# Patient Record
Sex: Male | Born: 1955 | Race: White | Hispanic: No | Marital: Married
Health system: Southern US, Community
[De-identification: ages and names within clinical notes are randomized; demographics above are authoritative.]

---

## 2005-07-22 ENCOUNTER — Ambulatory Visit: Payer: Self-pay | Admitting: Gastroenterology

## 2009-10-29 ENCOUNTER — Emergency Department: Payer: Self-pay | Admitting: Internal Medicine

## 2009-12-28 DEATH — deceased

## 2011-04-04 IMAGING — US US EXTREM LOW VENOUS*L*
1 series · 17 of 24 positions shown · non-contrast
Comparison: none

REASON FOR EXAM: leg swelling
COMMENTS:

[Series 1: us extrem low venous*left* · 17 of 29 slices shown]
[im 1/29]
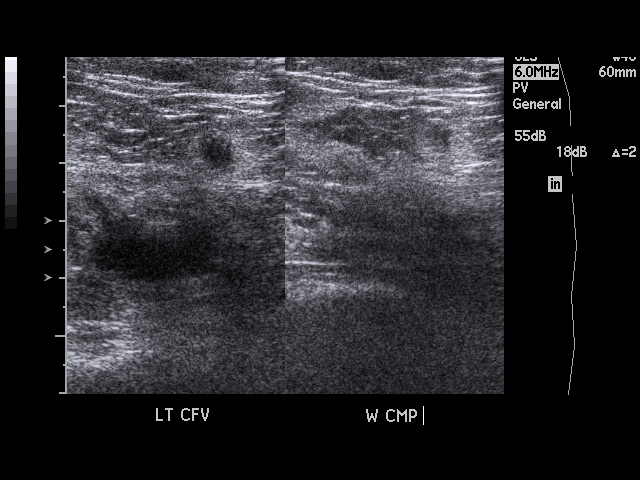
[im 3/29]
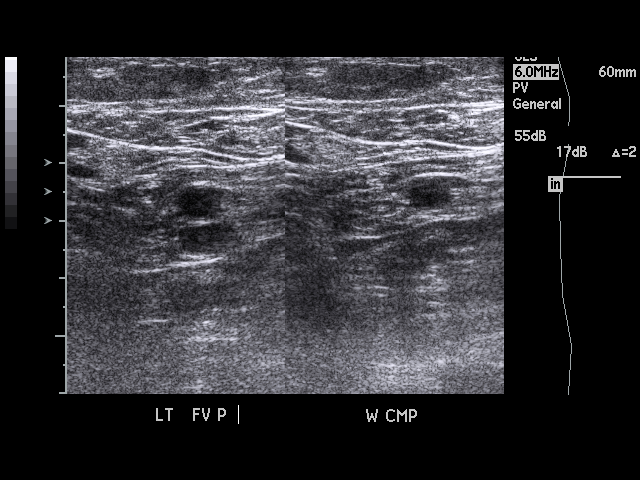
[im 4/29]
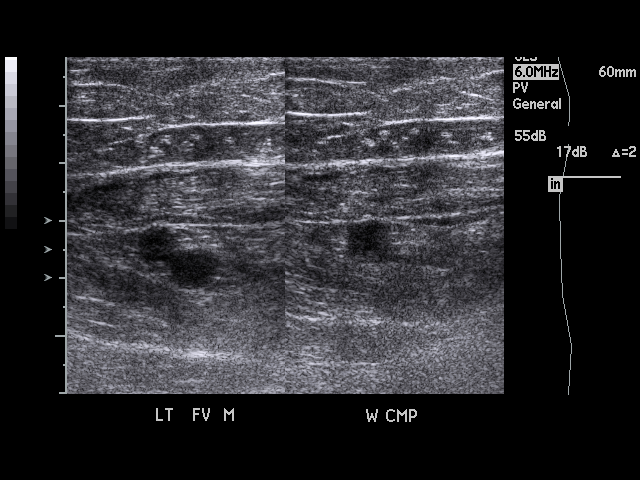
[im 5/29]
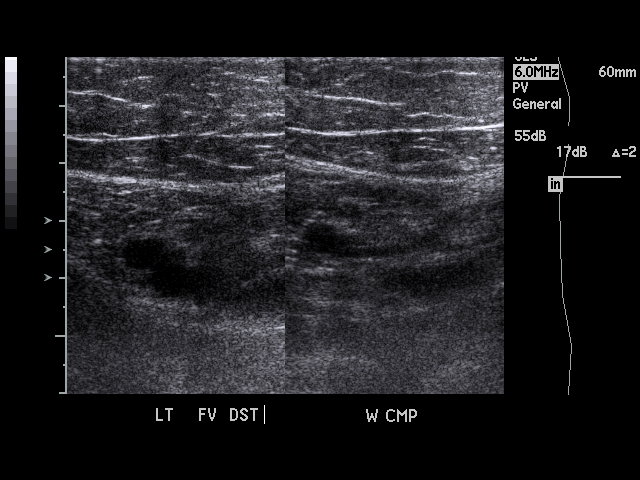
[im 8/29]
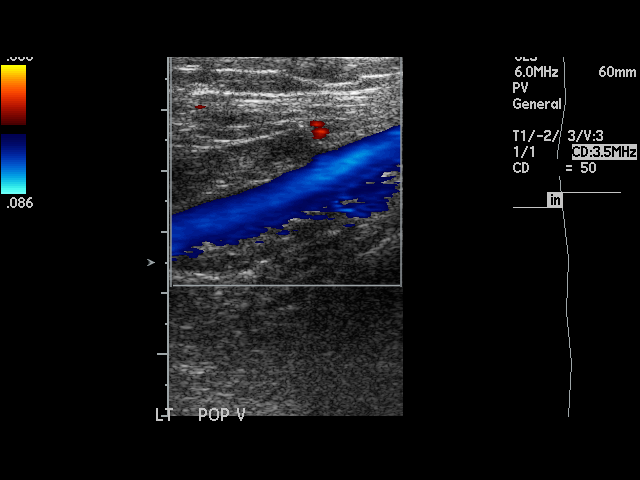
[im 9/29]
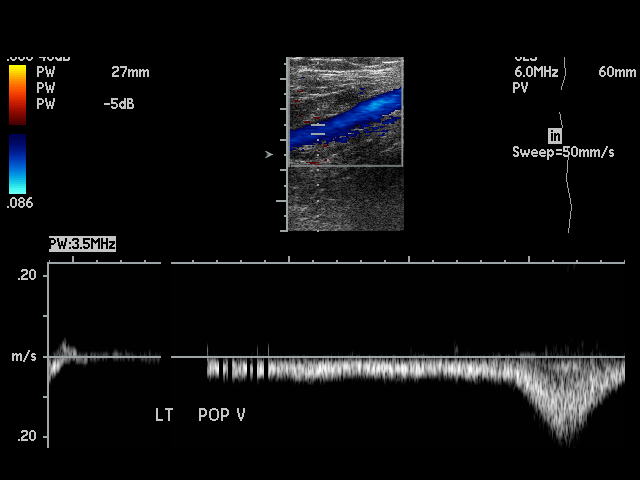
[im 11/29]
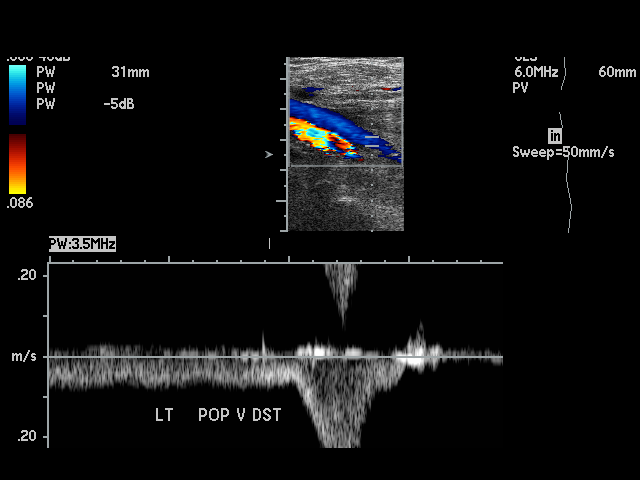
[im 13/29]
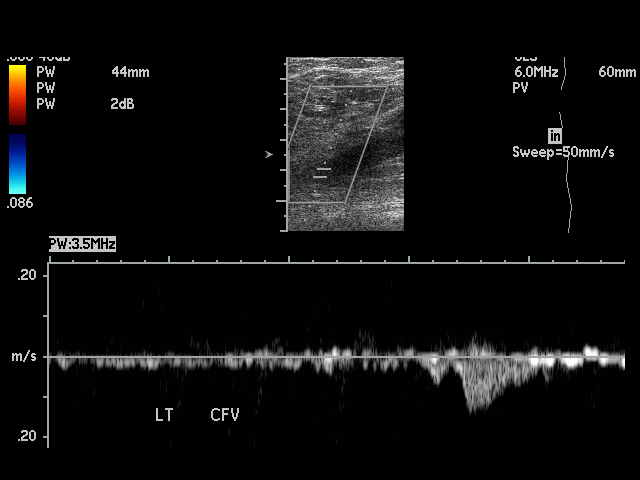
[im 15/29]
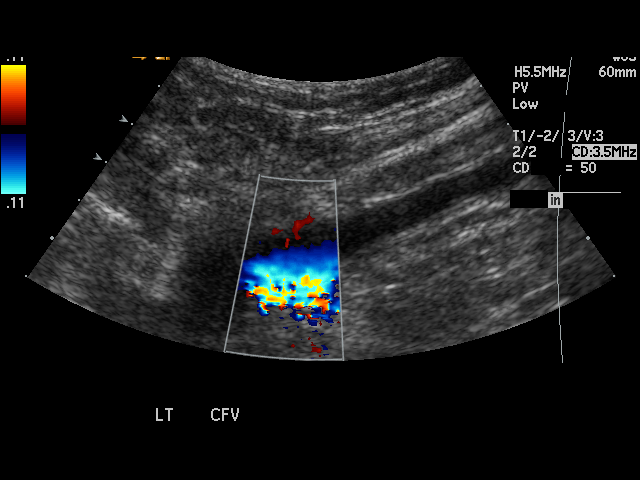
[im 16/29]
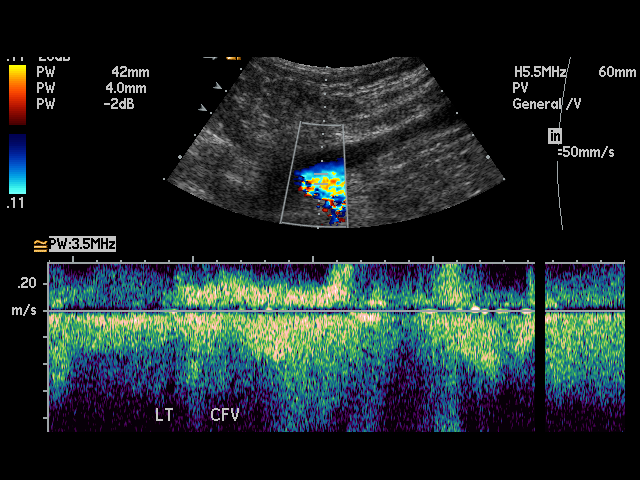
[im 18/29]
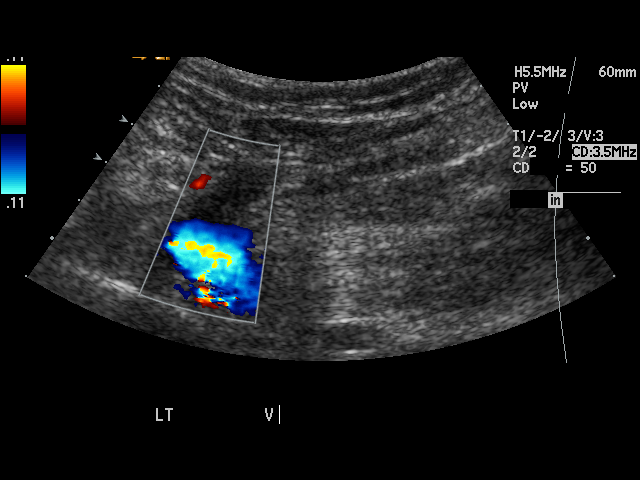
[im 20/29]
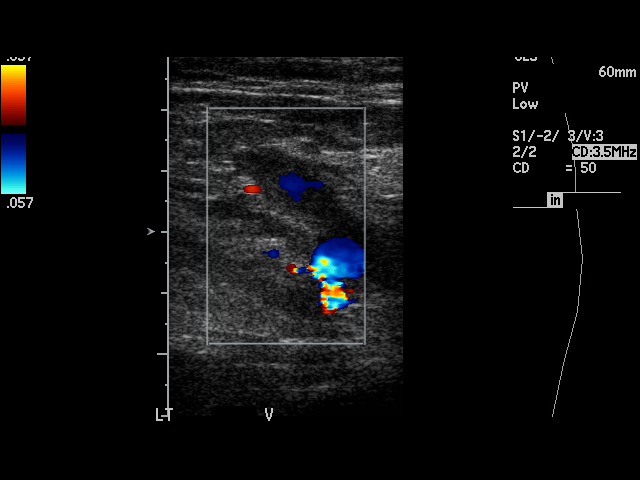
[im 21/29]
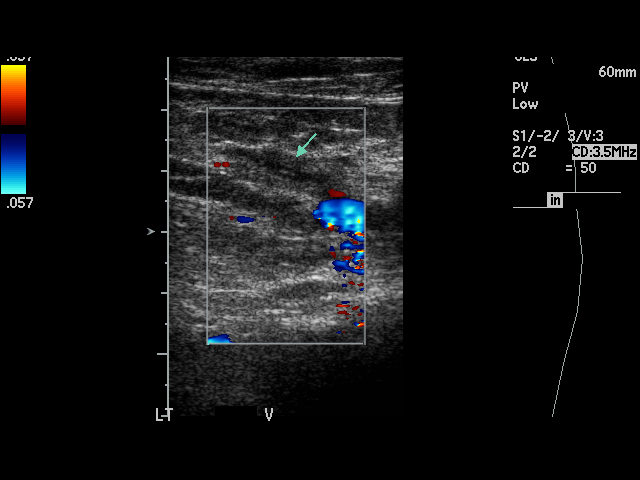
[im 24/29]
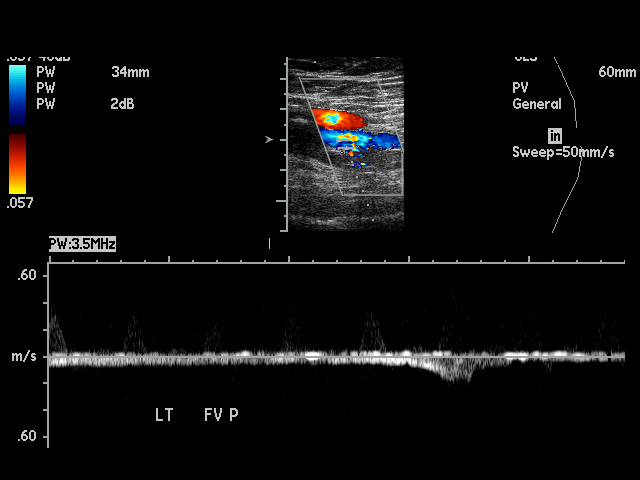
[im 25/29]
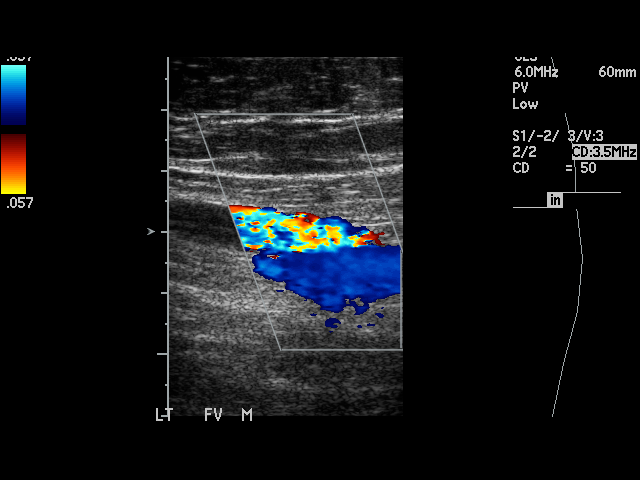
[im 26/29]
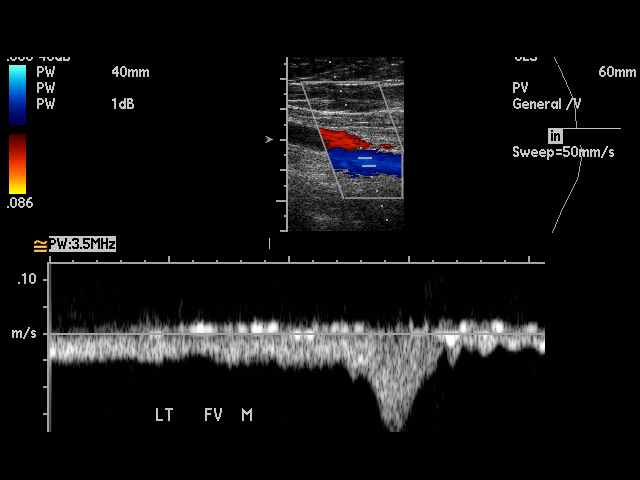
[im 29/29]
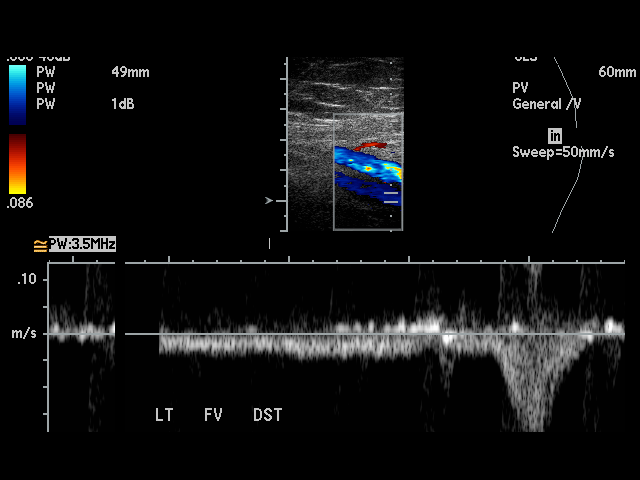

[17 of 24 positions shown; findings below may reference images not displayed]

PROCEDURE:     US  - US DOPPLER LOW EXTR LEFT  - October 29, 2009  [DATE]

RESULT:     The left femoral and popliteal veins were interrogated using
color Doppler ultrasound.

The femoral and popliteal veins are normally compressible. There is no
evidence of occlusive or nonocclusive thrombus. However, in the saphenous
vein there is nonocclusive thrombus on the left.
IMPRESSION: I do not see evidence of thrombus within the left common
femoral or superficial femoral or popliteal veins. There is nonocclusive
thrombus in the left saphenous vein.

## 2011-04-04 IMAGING — CT CT CHEST W/ CM
1 of 2 series · 14 of 32 positions shown, 18 images · IV contrast (APPLIED)
Comparison: none

REASON FOR EXAM: syncope el d-d
COMMENTS:

[Series 5: lung windows · axial · 0.77mm/px · z∈[-552,-306]mm · 14 of 98 slices shown, 18 images]
[im 8/98  mediastinal]
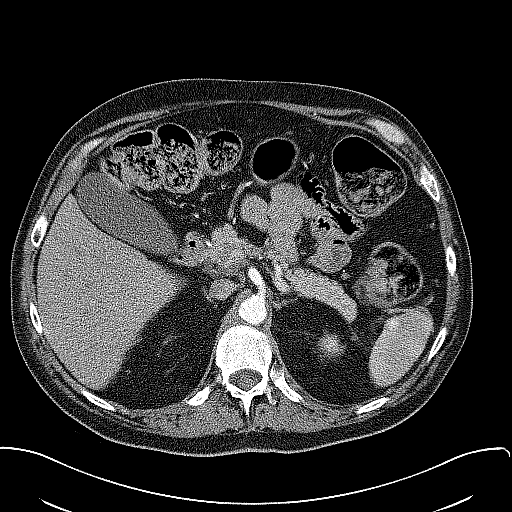
[im 8/98  lung]
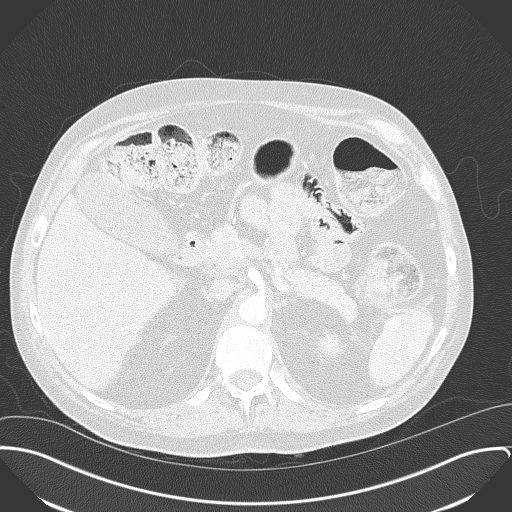
[im 15/98  lung]
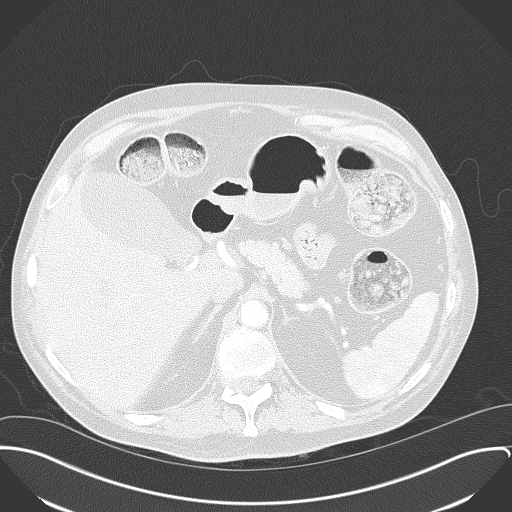
[im 23/98  lung]
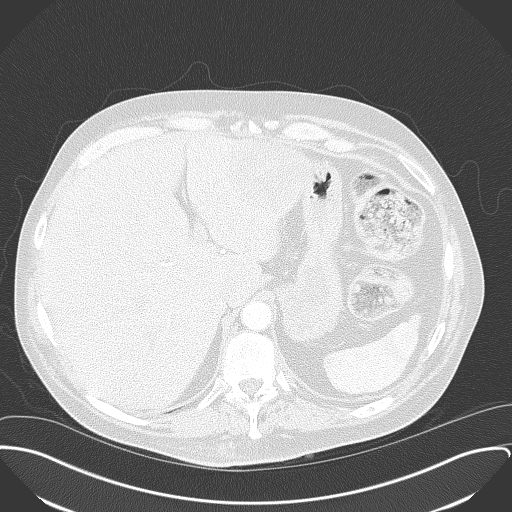
[im 30/98  lung]
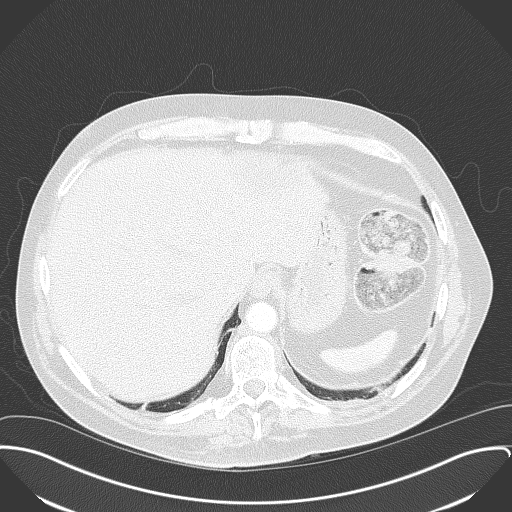
[im 38/98  mediastinal]
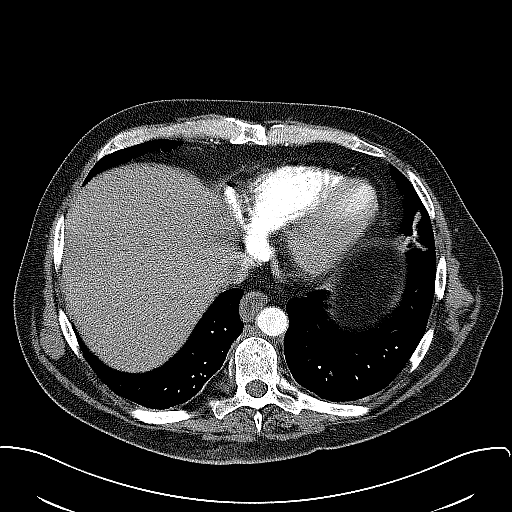
[im 38/98  lung]
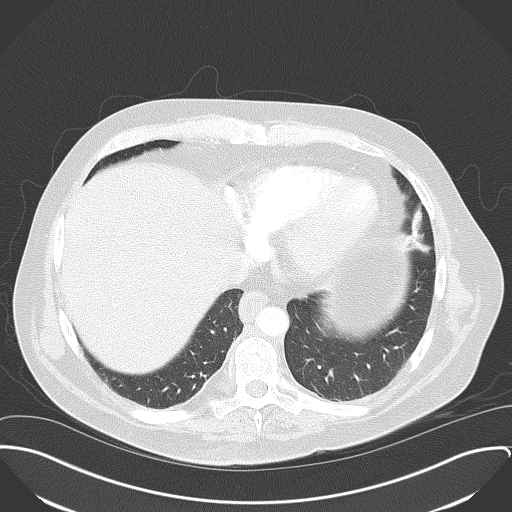
[im 45/98  lung]
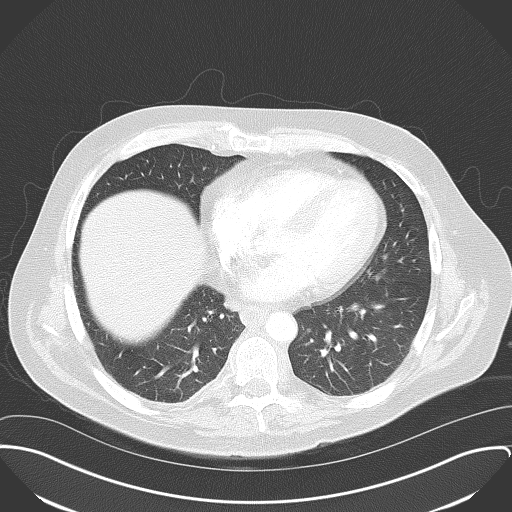
[im 46/98  lung]
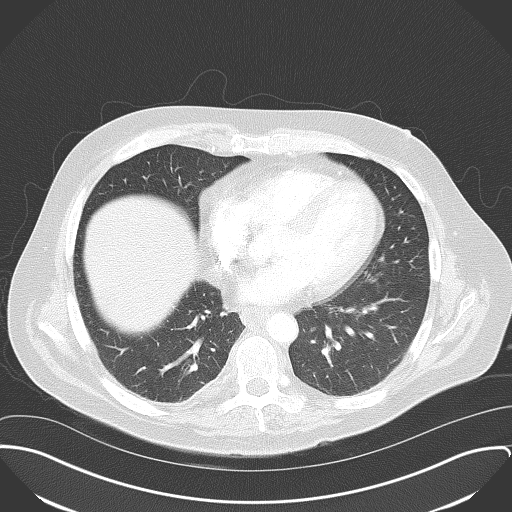
[im 49/98  lung]
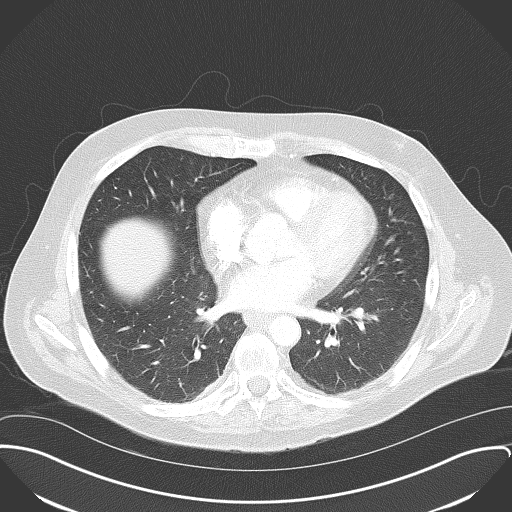
[im 53/98  mediastinal]
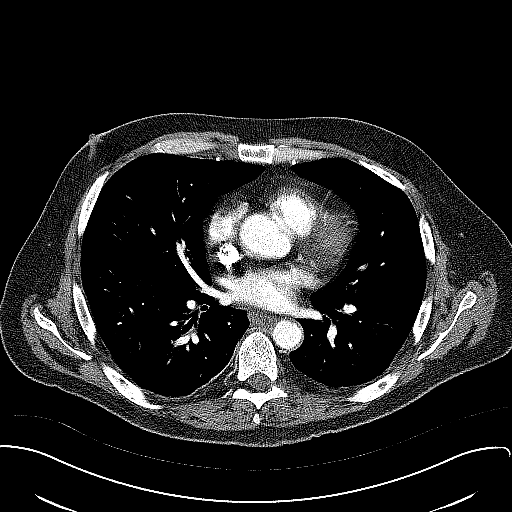
[im 53/98  lung]
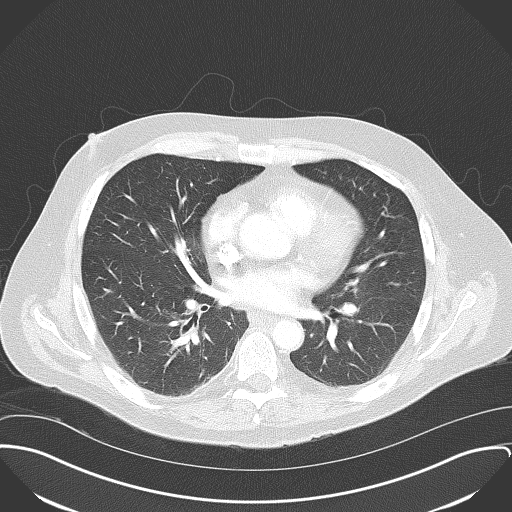
[im 60/98  lung]
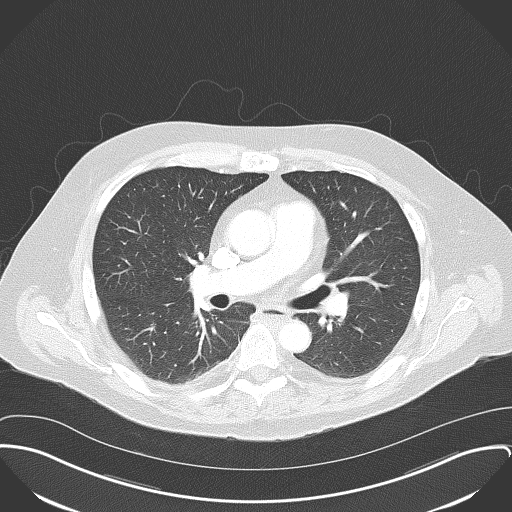
[im 68/98  lung]
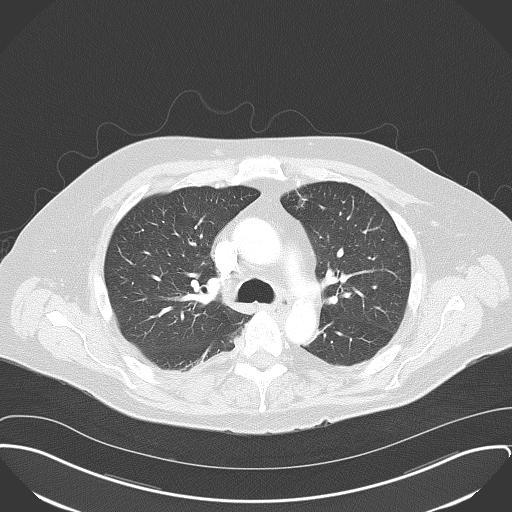
[im 75/98  lung]
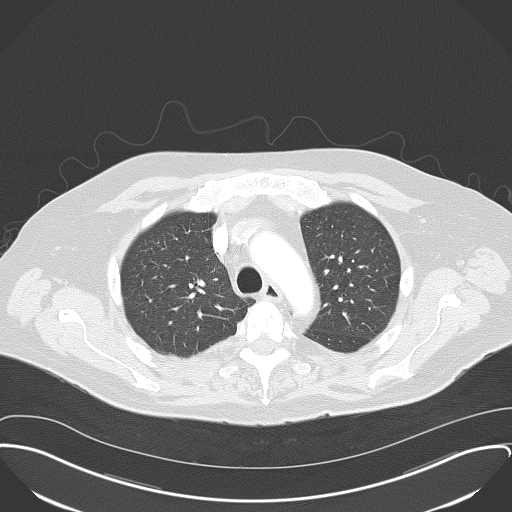
[im 83/98  mediastinal]
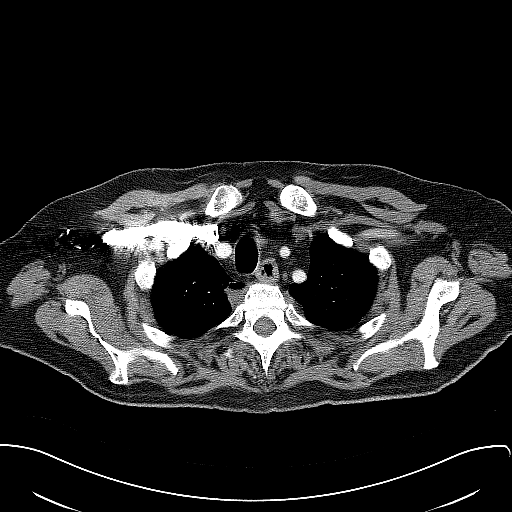
[im 83/98  lung]
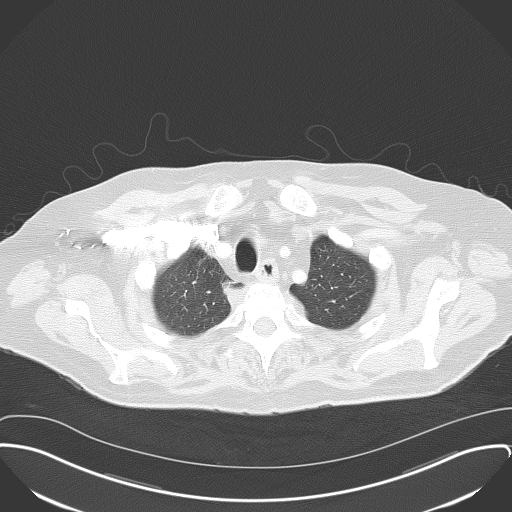
[im 90/98  lung]
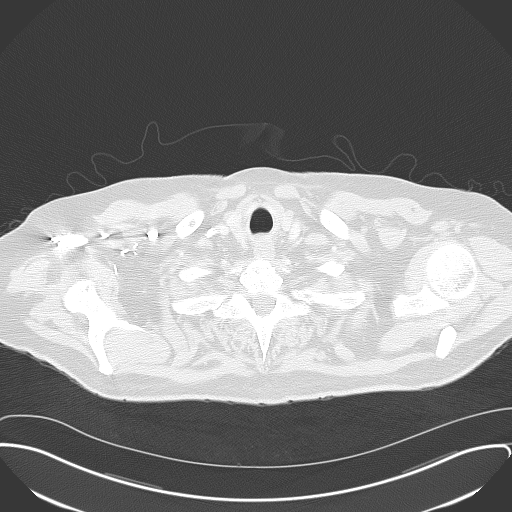

[14 of 32 positions shown; findings below may reference images not displayed]

PROCEDURE:     CT  - CT CHEST (FOR PE) W  - October 29, 2009  [DATE]

RESULT:     Axial CT scanning was performed through the chest at 3 mm
intervals and slice thicknesses following intravenous administration of 100
cc of Isovue 370. Review of multiplanar reconstructed images was performed
separately on the VIA monitor.

Contrast within the pulmonary arterial tree is normal in appearance. I do
not see evidence of an acute pulmonary embolism. The caliber of the thoracic
aorta is normal. The cardiac chambers are not enlarged. There is no pleural
nor pericardial effusion. I see no pathologic sized mediastinal or hilar
lymph nodes. There is mild thickening of the wall of the distal esophagus
which is nonspecific.

At lung window settings there is minimal atelectasis in the costophrenic
gutters bilaterally. I do not see pulmonary parenchymal masses.

Within the upper abdomen there is an abnormal appearance of the left lobe of
the liver. Here there is a mass measuring 7 cm AP x 4.9 cm transversely.
There is an additional similar appearing mass in the right lobe measuring
2.2 cm in diameter. A third mass in the right lobe near the dome of the
liver measures 1.4 cm in diameter. The spleen exhibits no acute abnormality
but there is a rim-enhancing nodule which is nonspecific and could reflect a
metastatic focus in the appropriate clinical setting. There are
hypodensities within the pancreatic head which are nonspecific. I cannot
exclude small cysts or masses here. I see no peripancreatic inflammatory
change. The gallbladder is adequately distended with no calcified stones.
The thoracic vertebral bodies are preserved in height.
IMPRESSION: 1. There is an abnormal appearance of the liver worrisome for malignancy.
There is a peripherally enhancing lesion in the spleen which is nonspecific.
There are no prior studies with which to compare.
2. There is heterogeneous density in the pancreatic head but the pancreas is
not completely included in the field-of-view.
3. I do not see evidence of acute pulmonary embolism. No acute thoracic
aortic abnormality is demonstrated.
4. I do not see evidence of CHF nor of pneumonia. No pathologic sized
mediastinal or hilar lymph nodes are seen.
5. There is subjective thickening of the wall of the distal esophagus with
air in the more proximal and mid portions of the esophagus. I cannot exclude
the presence of a infiltrative process in the distal esophagus.

Oncologic evaluation is recommended.

This report was called by me directly to Dr. Geneildo in the emergency
department at the conclusion of the study.

## 2014-09-06 ENCOUNTER — Encounter: Payer: Self-pay | Admitting: Gastroenterology

## 2014-09-06 NOTE — Telephone Encounter (Signed)
Error
